# Patient Record
Sex: Female | Born: 1995 | Race: White | Hispanic: No | Marital: Single | Smoking: Never smoker
Health system: Southern US, Community
[De-identification: ages and names within clinical notes are randomized; demographics above are authoritative.]

## PROBLEM LIST (undated history)

## (undated) DIAGNOSIS — F419 Anxiety disorder, unspecified: Secondary | ICD-10-CM

## (undated) DIAGNOSIS — K219 Gastro-esophageal reflux disease without esophagitis: Secondary | ICD-10-CM

## (undated) DIAGNOSIS — J302 Other seasonal allergic rhinitis: Secondary | ICD-10-CM

## (undated) DIAGNOSIS — K589 Irritable bowel syndrome without diarrhea: Secondary | ICD-10-CM

## (undated) HISTORY — PX: ADENOIDECTOMY: SUR15

## (undated) HISTORY — PX: OTHER SURGICAL HISTORY: SHX169

---
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
## 2016-04-17 ENCOUNTER — Encounter (HOSPITAL_BASED_OUTPATIENT_CLINIC_OR_DEPARTMENT_OTHER): Payer: Self-pay

## 2016-04-17 ENCOUNTER — Emergency Department (HOSPITAL_BASED_OUTPATIENT_CLINIC_OR_DEPARTMENT_OTHER): Payer: Commercial Managed Care - HMO

## 2016-04-17 ENCOUNTER — Emergency Department (HOSPITAL_BASED_OUTPATIENT_CLINIC_OR_DEPARTMENT_OTHER)
Admission: EM | Admit: 2016-04-17 | Discharge: 2016-04-17 | Disposition: A | Discharge status: Home / Self Care | Payer: Commercial Managed Care - HMO | Attending: Emergency Medicine | Admitting: Emergency Medicine

## 2016-04-17 DIAGNOSIS — T07XXXA Unspecified multiple injuries, initial encounter: Secondary | ICD-10-CM

## 2016-04-17 DIAGNOSIS — Y939 Activity, unspecified: Secondary | ICD-10-CM | POA: Insufficient documentation

## 2016-04-17 DIAGNOSIS — J45909 Unspecified asthma, uncomplicated: Secondary | ICD-10-CM | POA: Diagnosis not present

## 2016-04-17 DIAGNOSIS — Y999 Unspecified external cause status: Secondary | ICD-10-CM | POA: Insufficient documentation

## 2016-04-17 DIAGNOSIS — S0990XA Unspecified injury of head, initial encounter: Secondary | ICD-10-CM | POA: Diagnosis present

## 2016-04-17 DIAGNOSIS — S0003XA Contusion of scalp, initial encounter: Secondary | ICD-10-CM | POA: Diagnosis not present

## 2016-04-17 DIAGNOSIS — Y929 Unspecified place or not applicable: Secondary | ICD-10-CM | POA: Diagnosis not present

## 2016-04-17 DIAGNOSIS — S40022A Contusion of left upper arm, initial encounter: Secondary | ICD-10-CM | POA: Diagnosis not present

## 2016-04-17 DIAGNOSIS — S40021A Contusion of right upper arm, initial encounter: Secondary | ICD-10-CM | POA: Insufficient documentation

## 2016-04-17 DIAGNOSIS — T71193A Asphyxiation due to mechanical threat to breathing due to other causes, assault, initial encounter: Secondary | ICD-10-CM

## 2016-04-17 DIAGNOSIS — S1093XA Contusion of unspecified part of neck, initial encounter: Secondary | ICD-10-CM | POA: Insufficient documentation

## 2016-04-17 DIAGNOSIS — S0512XA Contusion of eyeball and orbital tissues, left eye, initial encounter: Secondary | ICD-10-CM | POA: Insufficient documentation

## 2016-04-17 LAB — HCG, SERUM, QUALITATIVE: PREG SERUM: NEGATIVE

## 2016-04-17 MED ORDER — HYDROCODONE-ACETAMINOPHEN 5-325 MG PO TABS: 1.0000 | ORAL_TABLET | ORAL | 0 refills | Status: DC | PRN

## 2016-04-17 MED ORDER — ONDANSETRON HCL 4 MG PO TABS: 4.0000 mg | ORAL_TABLET | Freq: Three times a day (TID) | ORAL | 0 refills | Status: DC | PRN

## 2016-04-17 MED ORDER — ONDANSETRON 4 MG PO TBDP
4.0000 mg | ORAL_TABLET | Freq: Once | ORAL | Status: AC
  Administered 2016-04-17: 4 mg via ORAL
  Filled 2016-04-17: qty 1

## 2016-04-17 MED ORDER — IOPAMIDOL (ISOVUE-300) INJECTION 61%
100.0000 mL | Freq: Once | INTRAVENOUS | Status: AC | PRN
  Administered 2016-04-17: 100 mL via INTRAVENOUS

## 2016-04-17 MED ORDER — HYDROCODONE-ACETAMINOPHEN 5-325 MG PO TABS
1.0000 | ORAL_TABLET | Freq: Once | ORAL | Status: AC
  Administered 2016-04-17: 1 via ORAL
  Filled 2016-04-17: qty 1
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
## 2016-08-23 ENCOUNTER — Emergency Department (HOSPITAL_BASED_OUTPATIENT_CLINIC_OR_DEPARTMENT_OTHER)
Admission: EM | Admit: 2016-08-23 | Discharge: 2016-08-23 | Disposition: A | Discharge status: Home / Self Care | Payer: Commercial Managed Care - HMO | Attending: Emergency Medicine | Admitting: Emergency Medicine

## 2016-08-23 ENCOUNTER — Encounter (HOSPITAL_BASED_OUTPATIENT_CLINIC_OR_DEPARTMENT_OTHER): Payer: Self-pay | Admitting: *Deleted

## 2016-08-23 DIAGNOSIS — K219 Gastro-esophageal reflux disease without esophagitis: Secondary | ICD-10-CM

## 2016-08-23 DIAGNOSIS — R112 Nausea with vomiting, unspecified: Secondary | ICD-10-CM | POA: Diagnosis not present

## 2016-08-23 DIAGNOSIS — R197 Diarrhea, unspecified: Secondary | ICD-10-CM | POA: Insufficient documentation

## 2016-08-23 DIAGNOSIS — R1084 Generalized abdominal pain: Secondary | ICD-10-CM | POA: Diagnosis present

## 2016-08-23 DIAGNOSIS — J45909 Unspecified asthma, uncomplicated: Secondary | ICD-10-CM | POA: Diagnosis not present

## 2016-08-23 LAB — CBC
HEMATOCRIT: 42.8 % (ref 36.0–46.0)
HEMOGLOBIN: 14.9 g/dL (ref 12.0–15.0)
MCH: 30.3 pg (ref 26.0–34.0)
MCHC: 34.8 g/dL (ref 30.0–36.0)
MCV: 87.2 fL (ref 78.0–100.0)
Platelets: 271 10*3/uL (ref 150–400)
RBC: 4.91 MIL/uL (ref 3.87–5.11)
RDW: 12 % (ref 11.5–15.5)
WBC: 14 10*3/uL — ABNORMAL HIGH (ref 4.0–10.5)

## 2016-08-23 LAB — COMPREHENSIVE METABOLIC PANEL
ALK PHOS: 61 U/L (ref 38–126)
ALT: 17 U/L (ref 14–54)
ANION GAP: 10 (ref 5–15)
AST: 24 U/L (ref 15–41)
Albumin: 4.6 g/dL (ref 3.5–5.0)
BUN: 12 mg/dL (ref 6–20)
CALCIUM: 9.8 mg/dL (ref 8.9–10.3)
CHLORIDE: 106 mmol/L (ref 101–111)
CO2: 24 mmol/L (ref 22–32)
Creatinine, Ser: 0.88 mg/dL (ref 0.44–1.00)
GFR calc non Af Amer: >60 mL/min (ref >60)
GLUCOSE: 104 mg/dL — AB (ref 65–99)
POTASSIUM: 3.5 mmol/L (ref 3.5–5.1)
Sodium: 140 mmol/L (ref 135–145)
Total Bilirubin: 0.5 mg/dL (ref 0.3–1.2)
Total Protein: 7.3 g/dL (ref 6.5–8.1)

## 2016-08-23 LAB — URINALYSIS, ROUTINE W REFLEX MICROSCOPIC
Bilirubin Urine: NEGATIVE
Glucose, UA: NEGATIVE mg/dL
HGB URINE DIPSTICK: NEGATIVE
Ketones, ur: NEGATIVE mg/dL
Leukocytes, UA: NEGATIVE
Nitrite: NEGATIVE
PH: 8 (ref 5.0–8.0)
Protein, ur: NEGATIVE mg/dL
SPECIFIC GRAVITY, URINE: 1.02 (ref 1.005–1.030)

## 2016-08-23 LAB — LIPASE, BLOOD: Lipase: 25 U/L (ref 11–51)

## 2016-08-23 LAB — PREGNANCY, URINE: PREG TEST UR: NEGATIVE

## 2016-08-23 MED ORDER — SODIUM CHLORIDE 0.9 % IV BOLUS (SEPSIS)
1000.0000 mL | Freq: Once | INTRAVENOUS | Status: AC
Start: 2016-08-23 — End: 2016-08-23
  Administered 2016-08-23: 1000 mL via INTRAVENOUS

## 2016-08-23 MED ORDER — SUCRALFATE 1 G PO TABS: 1.0000 g | ORAL_TABLET | Freq: Three times a day (TID) | ORAL | 0 refills | Status: DC

## 2016-08-23 MED ORDER — PROMETHAZINE HCL 25 MG PO TABS: 25.0000 mg | ORAL_TABLET | Freq: Four times a day (QID) | ORAL | 0 refills | Status: DC | PRN

## 2016-08-23 MED ORDER — SODIUM CHLORIDE 0.9 % IV BOLUS (SEPSIS)
1000.0000 mL | Freq: Once | INTRAVENOUS | Status: AC
  Administered 2016-08-23: 1000 mL via INTRAVENOUS

## 2016-08-23 MED ORDER — LORAZEPAM 2 MG/ML IJ SOLN
0.5000 mg | Freq: Once | INTRAMUSCULAR | Status: AC
  Administered 2016-08-23: 0.5 mg via INTRAVENOUS
  Filled 2016-08-23: qty 1

## 2016-08-23 MED ORDER — GI COCKTAIL ~~LOC~~
30.0000 mL | Freq: Once | ORAL | Status: AC
  Administered 2016-08-23: 30 mL via ORAL
  Filled 2016-08-23: qty 30

## 2016-08-23 MED ORDER — PANTOPRAZOLE SODIUM 40 MG IV SOLR
40.0000 mg | Freq: Once | INTRAVENOUS | Status: AC
  Administered 2016-08-23: 40 mg via INTRAVENOUS
  Filled 2016-08-23: qty 40

## 2016-08-23 MED ORDER — ONDANSETRON HCL 4 MG/2ML IJ SOLN
4.0000 mg | Freq: Once | INTRAMUSCULAR | Status: AC | PRN
  Administered 2016-08-23: 4 mg via INTRAVENOUS

## 2016-08-23 MED ORDER — ONDANSETRON HCL 4 MG/2ML IJ SOLN
INTRAMUSCULAR | Status: AC
  Filled 2016-08-23: qty 2

## 2016-08-23 NOTE — Discharge Instructions (Signed)
Read the information below.  Use the prescribed medication as directed.  Please discuss all new medications with your pharmacist.  You may return to the Emergency Department at any time for worsening condition or any new symptoms that concern you.   If you develop high fevers, worsening abdominal pain, uncontrolled vomiting, or are unable to tolerate fluids by mouth, return to the ER for a recheck.  ° °

## 2016-08-23 NOTE — ED Triage Notes (Signed)
Abdominal pain, N/V/D x 4-5 days.  Pt has had zofran.  Has had chronic 'GI issues' over the last several months-has seen GI.

## 2016-08-23 NOTE — ED Provider Notes (Signed)
[facility]   CSN: [id number] Arrival date & time: 08/23/16  1125     History   Chief Complaint Chief Complaint  Patient presents with  . Abdominal Pain    HPI Kathleen Chang is a 21 y.o. female.  HPI Pt with hx GERD, IBS, currently off her PPI p/w with two weeks of diffuse abdominal pain that is constant, 1 week of constant nausea with vomiting and diarrhea x several days.  States every morning if she does not eat she vomits bile and blood.  She also notes her diarrhea is bloody.  She was seen by her PCP this morning and was going to restart PPI as well as anxiety meds but states she wanted to come to the hospital because she is tired of feeling this way.  Pt has multiple food allergies, unsure of her exposures because she works at [employer] and is unable to control what she eats well.  Has had 5 lb weight loss over 2 weeks.    Past Medical History:  Diagnosis Date  . Asthma   . Seasonal allergies     There are no active problems to display for this patient.   Past Surgical History:  Procedure Laterality Date  . ADENOIDECTOMY    . adnoidectomy      OB History    No data available       Home Medications    Prior to Admission medications   Medication Sig Start Date End Date Taking? Authorizing Provider  ondansetron (ZOFRAN) 4 MG tablet Take 1 tablet (4 mg total) by mouth every 8 (eight) hours as needed for nausea or vomiting. 04/17/16   Loren Racer, MD  promethazine (PHENERGAN) 25 MG tablet Take 1 tablet (25 mg total) by mouth every 6 (six) hours as needed for nausea. 08/23/16   Trixie Dredge, PA-C  sucralfate (CARAFATE) 1 g tablet Take 1 tablet (1 g total) by mouth 4 (four) times daily -  with meals and at bedtime. 08/23/16   Trixie Dredge, PA-C    Family History History reviewed. No pertinent family history.  Social History Social History  Substance Use Topics  . Smoking status: Never Smoker  . Smokeless tobacco: Never Used  .  Alcohol use No     Allergies   Albuterol   Review of Systems Review of Systems  All other systems reviewed and are negative.    Physical Exam Updated Vital Signs BP 99/67 (BP Location: Right Arm)   Pulse 65   Temp 97.8 F (36.6 C) (Oral)   Resp 18   Ht 5\' 1"  (1.549 m)   Wt 47.6 kg (105 lb)   LMP 08/16/2016   SpO2 98%   BMI 19.84 kg/m   Physical Exam  Constitutional: She appears well-developed and well-nourished. No distress.  HENT:  Head: Normocephalic and atraumatic.  Neck: Neck supple.  Cardiovascular: Normal rate and regular rhythm.   Pulmonary/Chest: Effort normal and breath sounds normal. No respiratory distress. She has no wheezes. She has no rales.  Abdominal: Soft. She exhibits no distension. There is tenderness (diffuse). There is no rebound and no guarding.  Neurological: She is alert.  Skin: She is not diaphoretic.  Nursing note and vitals reviewed.    ED Treatments / Results  Labs (all labs ordered are listed, but only abnormal results are displayed) Labs Reviewed  COMPREHENSIVE METABOLIC PANEL - Abnormal; Notable for the following:       Result Value   Glucose, Bld 104 (*)  All other components within normal limits  CBC - Abnormal; Notable for the following:    WBC 14.0 (*)    All other components within normal limits  URINALYSIS, ROUTINE W REFLEX MICROSCOPIC - Abnormal; Notable for the following:    APPearance CLOUDY (*)    All other components within normal limits  LIPASE, BLOOD  PREGNANCY, URINE    EKG  EKG Interpretation None       Radiology No results found.  Procedures Procedures (including critical care time)  Medications Ordered in ED Medications  ondansetron (ZOFRAN) injection 4 mg (4 mg Intravenous Given 08/23/16 1153)  sodium chloride 0.9 % bolus 1,000 mL (0 mLs Intravenous Stopped 08/23/16 1247)  pantoprazole (PROTONIX) injection 40 mg (40 mg Intravenous Given 08/23/16 1206)  LORazepam (ATIVAN) injection 0.5 mg (0.5 mg  Intravenous Given 08/23/16 1206)  gi cocktail (Maalox,Lidocaine,Donnatal) (30 mLs Oral Given 08/23/16 1205)  sodium chloride 0.9 % bolus 1,000 mL (0 mLs Intravenous Stopped 08/23/16 1324)     Initial Impression / Assessment and Plan / ED Course  I have reviewed the triage vital signs and the nursing notes.  Pertinent labs & imaging results that were available during my care of the patient were reviewed by me and considered in my medical decision making (see chart for details).  Clinical Course as of Aug 24 1342  Fri Aug 23, 2016  1309 Tolerating PO.  Feeling much better now.    [EW]  1310 Repeat abdominal exam remains benign.    [EW]    Clinical Course User Index [EW] ChadWest, Hillery Bhalla, [place]PA-C    Afebrile, nontoxic patient with chronic abdominal pain, N/V, constipation/diarrhea c/w previously diagnosed GERD and IBS.  Has been off of her medications.  Had EGD last year and is known to have occasional hematemesis with her symptoms.  Her anxiety has also increased recently.  Was seen by PCP this morning and started back on the majority of her medications but came to ED for symptom relief.  Abdominal exam is benign, nonfocal.  Labs significant only for leukocytosis, which I suspect is related to pain/vomiting, doubt acute infectious process.  Tolerating PO after treatment.   D/C home with addition of carafate and phenergan PRN to omeprazole, zofran, anxiety meds given by PCP.  Discussed result, findings, treatment, and follow up  with patient.  Pt given return precautions.  Pt verbalizes understanding and agrees with plan.       Final Clinical Impressions(s) / ED Diagnoses   Final diagnoses:  Generalized abdominal pain  Nausea vomiting and diarrhea  Gastroesophageal reflux disease, esophagitis presence not specified    New Prescriptions Discharge Medication List as of 08/23/2016  1:12 PM    START taking these medications   Details  promethazine (PHENERGAN) 25 MG tablet Take 1 tablet (25 mg total) by  mouth every 6 (six) hours as needed for nausea., Starting Fri 08/23/2016, Print    sucralfate (CARAFATE) 1 g tablet Take 1 tablet (1 g total) by mouth 4 (four) times daily -  with meals and at bedtime., Starting Fri 08/23/2016, Print         Trixie DredgeWest, Raesean Bartoletti, PA-C 08/23/16 1347    Blane OharaZavitz, Joshua, MD 08/27/16 [phone]

## 2016-08-23 NOTE — ED Notes (Signed)
Patient feeling better, less anxious. Denies any Nausea feeling. The patient playing on phone

## 2016-08-23 NOTE — ED Notes (Signed)
Pt given water 

## 2016-08-23 NOTE — ED Notes (Signed)
Giving medications and patient is shivering with anxiety. Family at the bedside rubbing head  - patient made aware the feelings that she is having related to the medications is typical

## 2017-06-16 ENCOUNTER — Other Ambulatory Visit: Payer: Self-pay

## 2017-06-16 ENCOUNTER — Encounter (HOSPITAL_BASED_OUTPATIENT_CLINIC_OR_DEPARTMENT_OTHER): Payer: Self-pay | Admitting: *Deleted

## 2017-06-16 ENCOUNTER — Emergency Department (HOSPITAL_BASED_OUTPATIENT_CLINIC_OR_DEPARTMENT_OTHER)
Admission: EM | Admit: 2017-06-16 | Discharge: 2017-06-16 | Disposition: A | Discharge status: Home / Self Care | Payer: 59 | Attending: Emergency Medicine | Admitting: Emergency Medicine

## 2017-06-16 ENCOUNTER — Ambulatory Visit (HOSPITAL_BASED_OUTPATIENT_CLINIC_OR_DEPARTMENT_OTHER): Payer: 59

## 2017-06-16 DIAGNOSIS — Z3A01 Less than 8 weeks gestation of pregnancy: Secondary | ICD-10-CM | POA: Diagnosis not present

## 2017-06-16 DIAGNOSIS — J45909 Unspecified asthma, uncomplicated: Secondary | ICD-10-CM | POA: Diagnosis not present

## 2017-06-16 DIAGNOSIS — Z79899 Other long term (current) drug therapy: Secondary | ICD-10-CM | POA: Insufficient documentation

## 2017-06-16 DIAGNOSIS — O9989 Other specified diseases and conditions complicating pregnancy, childbirth and the puerperium: Secondary | ICD-10-CM | POA: Insufficient documentation

## 2017-06-16 DIAGNOSIS — K5900 Constipation, unspecified: Secondary | ICD-10-CM | POA: Diagnosis not present

## 2017-06-16 DIAGNOSIS — Z3201 Encounter for pregnancy test, result positive: Secondary | ICD-10-CM | POA: Diagnosis not present

## 2017-06-16 DIAGNOSIS — R1030 Lower abdominal pain, unspecified: Secondary | ICD-10-CM | POA: Insufficient documentation

## 2017-06-16 DIAGNOSIS — O219 Vomiting of pregnancy, unspecified: Secondary | ICD-10-CM

## 2017-06-16 HISTORY — DX: Gastro-esophageal reflux disease without esophagitis: K21.9

## 2017-06-16 HISTORY — DX: Anxiety disorder, unspecified: F41.9

## 2017-06-16 LAB — URINALYSIS, ROUTINE W REFLEX MICROSCOPIC
BILIRUBIN URINE: NEGATIVE
Glucose, UA: NEGATIVE mg/dL
HGB URINE DIPSTICK: NEGATIVE
Ketones, ur: NEGATIVE mg/dL
Leukocytes, UA: NEGATIVE
Nitrite: NEGATIVE
Protein, ur: NEGATIVE mg/dL
SPECIFIC GRAVITY, URINE: 1.02 (ref 1.005–1.030)
pH: 7 (ref 5.0–8.0)

## 2017-06-16 LAB — HCG, QUANTITATIVE, PREGNANCY: hCG, Beta Chain, Quant, S: 36473 m[IU]/mL — ABNORMAL HIGH (ref <5)

## 2017-06-16 MED ORDER — DOXYLAMINE-PYRIDOXINE 10-10 MG PO TBEC: 1.0000 | DELAYED_RELEASE_TABLET | Freq: Every day | ORAL | 0 refills | Status: DC

## 2017-06-16 MED ORDER — ONDANSETRON HCL 4 MG/2ML IJ SOLN
4.0000 mg | Freq: Once | INTRAMUSCULAR | Status: AC
  Administered 2017-06-16: 4 mg via INTRAVENOUS
  Filled 2017-06-16: qty 2

## 2017-06-16 MED ORDER — SODIUM CHLORIDE 0.9 % IV BOLUS (SEPSIS)
1000.0000 mL | Freq: Once | INTRAVENOUS | Status: AC
  Administered 2017-06-16: 1000 mL via INTRAVENOUS

## 2017-06-16 MED ORDER — THIAMINE HCL 100 MG/ML IJ SOLN
100.0000 mg | Freq: Once | INTRAMUSCULAR | Status: AC
  Administered 2017-06-16: 100 mg via INTRAVENOUS
  Filled 2017-06-16: qty 2

## 2017-06-16 NOTE — ED Notes (Addendum)
EDP into room, prior to RN assessment, see MD notes, orders received and initiated.   

## 2017-06-16 NOTE — ED Notes (Signed)
[facility]olerating sips of [facility]ale[facility]

## 2017-06-16 NOTE — ED Triage Notes (Signed)
Pt states that she started vomiting yesterday morning. States she has been taking zofran without relief. Last dose of zofran was 3am. Pt state she is 7 weeks pregant. C/o headache. Denies any fevers.

## 2017-06-16 NOTE — Discharge Instructions (Addendum)
You were seen today with nausea and vomiting in pregnancy.  Follow-up later today for ultrasound.  Take likely just at night.  If you develop vaginal bleeding, worsening pain, lateralizing pain, or inability to tolerate fluids you should be reevaluated immediately.

## 2017-06-16 NOTE — ED Notes (Addendum)
Pregnant female here for NV. G1P0 LMP:  04/20/17 EDD:  01/24/2018 GA:  [id number] [facility], first appt in April  Alert, NAD, calm, interactive, resps e/u, speaking in clear complete sentences, no dyspnea noted, skin W&D, VSS, c/o ongoing persistent nausea, vomiting onset yesterday, also reports abd cramping, HA, always hot, dry mouth,(denies: fever, diarrhea, pain, sob, dizziness, urinary sx, back pain, vaginal d/c, or visual changes). EDP into room. [facility] Mentions h/o anxiety, and one panic attack. No longer taking/needing zoloft.

## 2017-06-16 NOTE — ED Provider Notes (Signed)
[facility] EMERGENCY DEPARTMENT Provider Note   CSN: [id number] Arrival date & time: 06/16/17  0407     History   Chief Complaint Chief Complaint  Patient presents with  . n/v    HPI Kathleen Chang is a 22 y.o. female.  HPI  This is a 22 year old G1 P0 female approximately [redacted] weeks pregnant who presents with nausea and vomiting.  Patient reports her last menstrual period was the last week in January.  She is due to see her OB on April 1.  Patient reports over the last 24-48 hours she has had multiple episodes of non-bilious, nonbloody emesis.  She has been taking Zofran with minimal relief.  She reports crampy lower abdominal pain that does not lateralize.  She denies any diarrhea.  She reports constipation.  Denies any vaginal discharge or vaginal bleeding.  She has not yet had an ultrasound.  Mother reports that she has been unable to keep anything down.  Past Medical History:  Diagnosis Date  . Anxiety   . Asthma   . GERD (gastroesophageal reflux disease)   . Seasonal allergies     There are no active problems to display for this patient.   Past Surgical History:  Procedure Laterality Date  . ADENOIDECTOMY    . adnoidectomy       OB History    Gravida  1   Para      Term      Preterm      AB      Living        SAB      TAB      Ectopic      Multiple      Live Births               Home Medications    Prior to Admission medications   Medication Sig Start Date End Date Taking? Authorizing Provider  Doxylamine-Pyridoxine 10-10 MG TBEC Take 1 tablet by mouth at bedtime. 06/16/17   Olyvia Gopal, Mayer Maskerourtney F, MD  ondansetron (ZOFRAN) 4 MG tablet Take 1 tablet (4 mg total) by mouth every 8 (eight) hours as needed for nausea or vomiting. 04/17/16   Loren RacerYelverton, David, MD  promethazine (PHENERGAN) 25 MG tablet Take 1 tablet (25 mg total) by mouth every 6 (six) hours as needed for nausea. 08/23/16   Trixie DredgeWest, Emily, PA-C  sucralfate (CARAFATE) 1 g tablet  Take 1 tablet (1 g total) by mouth 4 (four) times daily -  with meals and at bedtime. 08/23/16   Trixie DredgeWest, Emily, PA-C    Family History No family history on file.  Social History Social History   Tobacco Use  . Smoking status: Never Smoker  . Smokeless tobacco: Never Used  Substance Use Topics  . Alcohol use: No  . Drug use: No     Allergies   Albuterol   Review of Systems Review of Systems  Constitutional: Negative for fever.  Respiratory: Negative for shortness of breath.   Cardiovascular: Negative for chest pain.  Gastrointestinal: Positive for abdominal pain, constipation, nausea and vomiting. Negative for diarrhea.  Genitourinary: Negative for dysuria, vaginal bleeding and vaginal pain.  All other systems reviewed and are negative.    Physical Exam Updated Vital Signs BP (!) 104/48   Pulse 77   Temp 98.3 F (36.8 C)   Resp 18   Ht 5\' 1"  (1.549 m)   Wt 49.4 kg (109 lb)   LMP 04/20/2017 (Approximate)   SpO2 99%  BMI 20.60 kg/m   Physical Exam  Constitutional: She is oriented to person, place, and time. She appears well-developed and well-nourished. No distress.  HENT:  Head: Normocephalic and atraumatic.  Cardiovascular: Normal rate, regular rhythm and normal heart sounds.  Pulmonary/Chest: Effort normal. No respiratory distress. She has no wheezes.  Abdominal: Soft. Bowel sounds are normal. There is no tenderness. There is no rebound and no guarding.  Neurological: She is alert and oriented to person, place, and time.  Skin: Skin is warm and dry.  Psychiatric: She has a normal mood and affect.  Nursing note and vitals reviewed.    ED Treatments / Results  Labs (all labs ordered are listed, but only abnormal results are displayed) Labs Reviewed  HCG, QUANTITATIVE, PREGNANCY - Abnormal; Notable for the following components:      Result Value   hCG, Beta Chain, Quant, S 36,473 (*)    All other components within normal limits  URINALYSIS, ROUTINE W  REFLEX MICROSCOPIC - Abnormal; Notable for the following components:   APPearance HAZY (*)    All other components within normal limits    EKG None  Radiology No results found.  Procedures Procedures (including critical care time)  EMERGENCY DEPARTMENT [place] PREGNANCY "Study: Limited Ultrasound of the Pelvis for Pregnancy"  INDICATIONS:Pregnancy(required) Multiple views of the uterus and pelvic cavity were obtained in real-time with a multi-frequency probe.  APPROACH:Transabdominal  PERFORMED BY: Myself IMAGES ARCHIVED?: No LIMITATIONS: Emergent procedure PREGNANCY FREE FLUID: None GESTATIONAL AGE, ESTIMATE: < 8 weeks  FETAL HEART RATE: unable to measure INTERPRETATION: Intrauterine gestational sac noted, Yolk sac noted and Fetal pole present   Transabdominal ultrasound with intrauterine gestational sac, unable to measure fetal pole but fetal pole and yolk sac present      Medications Ordered in ED Medications  sodium chloride 0.9 % bolus 1,000 mL (0 mLs Intravenous Stopped 06/16/17 0517)  thiamine (B-1) injection 100 mg (100 mg Intravenous Given 06/16/17 0443)  ondansetron (ZOFRAN) injection 4 mg (4 mg Intravenous Given 06/16/17 0443)     Initial Impression / Assessment and Plan / ED Course  I have reviewed the triage vital signs and the nursing notes.  Pertinent labs & imaging results that were available during my care of the patient were reviewed by me and considered in my medical decision making (see chart for details).  Clinical Course as of Jun 16 549  Mon Jun 16, 2017  0548 Patient tolerating sips.   [CH]    Clinical Course User Index [CH] Claudean Leavelle, Mayer Masker, MD   Patient presents with nausea and vomiting in early pregnancy.  She is overall nontoxic appearing vital signs reassuring.  She reports abdominal pain but is nonlateralizing and has no significant tenderness on exam.  She denies any vaginal bleeding.  Patient was given fluids.  Urinalysis shows no  evidence of UTI or significant ketones to suggest dehydration.  Beta hCG is 36,473.  On recheck, patient is tolerating sips of fluid.  Bedside ultrasound shows intrauterine gestational sac and a fetal pole.  Unable to measure given transabdominal approach.  This is reassuring.  Will have patient return for formal ultrasound given abdominal cramping.  Will start Diclegis.  After history, exam, and medical workup I feel the patient has been appropriately medically screened and is safe for discharge home. Pertinent diagnoses were discussed with the patient. Patient was given return precautions.   Final Clinical Impressions(s) / ED Diagnoses   Final diagnoses:  Nausea and vomiting in pregnancy  ED Discharge Orders        Ordered    Doxylamine-Pyridoxine 10-10 MG TBEC  Daily at bedtime     06/16/17 0549    US OB Limited     06/16/17 0550    US OB Transvaginal     06/16/17 0550       Shon Baton, MD 06/16/17 [phone]

## 2017-06-16 NOTE — ED Notes (Signed)
EDP in to see, "feeling better, nausea decreased", given ginger ale, up to b/r for urine sample, steady gait.

## 2018-02-12 ENCOUNTER — Emergency Department (HOSPITAL_BASED_OUTPATIENT_CLINIC_OR_DEPARTMENT_OTHER): Payer: 59

## 2018-02-12 ENCOUNTER — Other Ambulatory Visit: Payer: Self-pay

## 2018-02-12 ENCOUNTER — Emergency Department (HOSPITAL_BASED_OUTPATIENT_CLINIC_OR_DEPARTMENT_OTHER)
Admission: EM | Admit: 2018-02-12 | Discharge: 2018-02-12 | Disposition: A | Discharge status: Home / Self Care | Payer: 59 | Attending: Emergency Medicine | Admitting: Emergency Medicine

## 2018-02-12 ENCOUNTER — Encounter (HOSPITAL_BASED_OUTPATIENT_CLINIC_OR_DEPARTMENT_OTHER): Payer: Self-pay

## 2018-02-12 DIAGNOSIS — R1031 Right lower quadrant pain: Secondary | ICD-10-CM | POA: Insufficient documentation

## 2018-02-12 DIAGNOSIS — J45909 Unspecified asthma, uncomplicated: Secondary | ICD-10-CM | POA: Insufficient documentation

## 2018-02-12 LAB — URINALYSIS, ROUTINE W REFLEX MICROSCOPIC
Bilirubin Urine: NEGATIVE
GLUCOSE, UA: NEGATIVE mg/dL
Ketones, ur: NEGATIVE mg/dL
Nitrite: NEGATIVE
PROTEIN: NEGATIVE mg/dL
SPECIFIC GRAVITY, URINE: 1.02 (ref 1.005–1.030)
pH: 6 (ref 5.0–8.0)

## 2018-02-12 LAB — CBC
HCT: 44.7 % (ref 36.0–46.0)
HEMOGLOBIN: 14.2 g/dL (ref 12.0–15.0)
MCH: 28.6 pg (ref 26.0–34.0)
MCHC: 31.8 g/dL (ref 30.0–36.0)
MCV: 89.9 fL (ref 80.0–100.0)
NRBC: 0 % (ref 0.0–0.2)
PLATELETS: 444 10*3/uL — AB (ref 150–400)
RBC: 4.97 MIL/uL (ref 3.87–5.11)
RDW: 12.1 % (ref 11.5–15.5)
WBC: 8.7 10*3/uL (ref 4.0–10.5)

## 2018-02-12 LAB — COMPREHENSIVE METABOLIC PANEL
ALBUMIN: 3.9 g/dL (ref 3.5–5.0)
ALK PHOS: 132 U/L — AB (ref 38–126)
ALT: 16 U/L (ref 0–44)
ANION GAP: 11 (ref 5–15)
AST: 21 U/L (ref 15–41)
BUN: 15 mg/dL (ref 6–20)
CO2: 25 mmol/L (ref 22–32)
Calcium: 9.4 mg/dL (ref 8.9–10.3)
Chloride: 104 mmol/L (ref 98–111)
Creatinine, Ser: 0.87 mg/dL (ref 0.44–1.00)
GFR calc Af Amer: >60 mL/min (ref >60)
GFR calc non Af Amer: >60 mL/min (ref >60)
GLUCOSE: 88 mg/dL (ref 70–99)
Potassium: 3.9 mmol/L (ref 3.5–5.1)
SODIUM: 140 mmol/L (ref 135–145)
Total Bilirubin: 0.5 mg/dL (ref 0.3–1.2)
Total Protein: 7.3 g/dL (ref 6.5–8.1)

## 2018-02-12 LAB — PREGNANCY, URINE: Preg Test, Ur: NEGATIVE

## 2018-02-12 LAB — URINALYSIS, MICROSCOPIC (REFLEX)

## 2018-02-12 LAB — LIPASE, BLOOD: Lipase: 66 U/L — ABNORMAL HIGH (ref 11–51)

## 2018-02-12 MED ORDER — IOPAMIDOL (ISOVUE-300) INJECTION 61%
100.0000 mL | Freq: Once | INTRAVENOUS | Status: AC | PRN
  Administered 2018-02-12: 100 mL via INTRAVENOUS

## 2018-02-12 MED ORDER — ONDANSETRON HCL 4 MG/2ML IJ SOLN
4.0000 mg | Freq: Once | INTRAMUSCULAR | Status: AC
  Administered 2018-02-12: 4 mg via INTRAVENOUS
  Filled 2018-02-12: qty 2

## 2018-02-12 MED ORDER — ONDANSETRON 4 MG PO TBDP: 4.0000 mg | ORAL_TABLET | Freq: Three times a day (TID) | ORAL | 0 refills | Status: DC | PRN

## 2018-02-12 MED ORDER — IOPAMIDOL (ISOVUE-300) INJECTION 61%: 30.0000 mL | Freq: Once | INTRAVENOUS | Status: DC | PRN

## 2018-02-12 NOTE — ED Notes (Signed)
Pt transported to CT-Delay in vitals.

## 2018-02-12 NOTE — ED Triage Notes (Signed)
C/o RLQ pain day 2-csection 2 weeks ago-NAD-steady gait

## 2018-02-12 NOTE — ED Notes (Signed)
Patient is unable to give a urine sample at this time but is aware we need one.

## 2018-02-12 NOTE — ED Provider Notes (Signed)
[facility] EMERGENCY DEPARTMENT Provider Note   CSN: [id number] Arrival date & time: 02/12/18  1129     History   Chief Complaint Chief Complaint  Patient presents with  . Abdominal Pain    HPI Kathleen Chang is a 22 y.o. female w PMHx asthma, GERD, G1P1 status post C-section 2 weeks ago, presenting to the emergency department with acute onset of right lower quadrant abdominal pain that began yesterday.  She states 2 days ago she began feeling nauseous with vomiting.  Yesterday she began having the pain in her lower abdomen.  She states her C-section has been healing well, with normal postpartum visit on Friday.  Denies redness or purulent drainage from the surgical wound.  Denies diarrhea, constipation, urinary symptoms, fevers.  States her vaginal bleeding is gradually improving since delivery. Has intermittently taken her prescribed oxycodone for pain, however did not take it much.  Reports remote history of ovarian cyst in the past, however is not sure if this feels similar.  The history is provided by the patient.    Past Medical History:  Diagnosis Date  . Anxiety   . Asthma   . GERD (gastroesophageal reflux disease)   . Seasonal allergies     There are no active problems to display for this patient.   Past Surgical History:  Procedure Laterality Date  . ADENOIDECTOMY    . adnoidectomy    . CESAREAN SECTION       OB History    Gravida  1   Para      Term      Preterm      AB      Living        SAB      TAB      Ectopic      Multiple      Live Births               Home Medications    Prior to Admission medications   Medication Sig Start Date End Date Taking? Authorizing Provider  Doxylamine-Pyridoxine 10-10 MG TBEC Take 1 tablet by mouth at bedtime. 06/16/17   Horton, Mayer Maskerourtney F, MD  ondansetron (ZOFRAN) 4 MG tablet Take 1 tablet (4 mg total) by mouth every 8 (eight) hours as needed for nausea or vomiting. 04/17/16   Loren RacerYelverton,  David, MD  promethazine (PHENERGAN) 25 MG tablet Take 1 tablet (25 mg total) by mouth every 6 (six) hours as needed for nausea. 08/23/16   Trixie DredgeWest, Emily, PA-C  sucralfate (CARAFATE) 1 g tablet Take 1 tablet (1 g total) by mouth 4 (four) times daily -  with meals and at bedtime. 08/23/16   Trixie DredgeWest, Emily, PA-C    Family History No family history on file.  Social History Social History   Tobacco Use  . Smoking status: Never Smoker  . Smokeless tobacco: Never Used  Substance Use Topics  . Alcohol use: No  . Drug use: No     Allergies   Albuterol   Review of Systems Review of Systems  Constitutional: Negative for fever.  Gastrointestinal: Positive for abdominal pain, nausea and vomiting. Negative for constipation and diarrhea.  Genitourinary: Positive for vaginal bleeding (improving, post-partum).  All other systems reviewed and are negative.    Physical Exam Updated Vital Signs BP (!) 107/59 (BP Location: Right Arm)   Pulse 68   Temp 98.5 F (36.9 C) (Oral)   Resp 18   Ht 5\' 1"  (1.549 m)   Wt  57.2 kg   LMP  (Approximate)   SpO2 100%   Breastfeeding? No   BMI 23.81 kg/m   Physical Exam  Constitutional: She appears well-developed and well-nourished. She does not appear ill. No distress.  HENT:  Head: Normocephalic and atraumatic.  Eyes: Conjunctivae are normal.  Cardiovascular: Normal rate, regular rhythm, normal heart sounds and intact distal pulses.  Pulmonary/Chest: Effort normal and breath sounds normal. No respiratory distress.  Abdominal: Soft. Bowel sounds are normal. She exhibits no distension. There is rebound. There is no guarding.  There is rebound tenderness to the right lower quadrant.  Uterus feels firm, just below the umbilicus and is nontender.  Surgical scar appears to be healing well, is well approximated, without surrounding erythema.  No purulent drainage.  Neurological: She is alert.  Skin: Skin is warm.  Psychiatric: She has a normal mood and affect.  Her behavior is normal.  Nursing note and vitals reviewed.    ED Treatments / Results  Labs (all labs ordered are listed, but only abnormal results are displayed) Labs Reviewed  LIPASE, BLOOD - Abnormal; Notable for the following components:      Result Value   Lipase 66 (*)    All other components within normal limits  COMPREHENSIVE METABOLIC PANEL - Abnormal; Notable for the following components:   Alkaline Phosphatase 132 (*)    All other components within normal limits  CBC - Abnormal; Notable for the following components:   Platelets 444 (*)    All other components within normal limits  URINALYSIS, ROUTINE W REFLEX MICROSCOPIC - Abnormal; Notable for the following components:   Hgb urine dipstick SMALL (*)    Leukocytes, UA TRACE (*)    All other components within normal limits  URINALYSIS, MICROSCOPIC (REFLEX) - Abnormal; Notable for the following components:   Bacteria, UA RARE (*)    All other components within normal limits  PREGNANCY, URINE    EKG None  Radiology Ct Abdomen Pelvis W Contrast  Result Date: 02/12/2018 CLINICAL DATA:  Abdominal pain, appendicitis suspected. RIGHT lower quadrant pain and nausea EXAM: CT ABDOMEN AND PELVIS WITH CONTRAST TECHNIQUE: Multidetector CT imaging of the abdomen and pelvis was performed using the standard protocol following bolus administration of intravenous contrast. CONTRAST:  ISOVUE-300 IOPAMIDOL (ISOVUE-300) INJECTION 61% COMPARISON:  None. FINDINGS: Lower chest: Lung bases are clear. Hepatobiliary: No focal hepatic lesion. No biliary duct dilatation. Gallbladder is normal. Common bile duct is normal. Pancreas: Pancreas is normal. No ductal dilatation. No pancreatic inflammation. Spleen: Normal spleen Adrenals/urinary tract: Adrenal glands and kidneys are normal. The ureters and bladder normal. Stomach/Bowel: Stomach, duodenum and small bowel normal. Tubular structure RIGHT lower quadrant favored the distal ileum leading up  to normal terminal ileum. The appendix is not identified. There is no peri cecal inflammation or fluid. Ascending, transverse and descending colon normal. Rectosigmoid colon normal. Vascular/Lymphatic: Abdominal aorta is normal caliber. There is no retroperitoneal or periportal lymphadenopathy. No pelvic lymphadenopathy. Reproductive: Uterus is large with a anterior wall cervical section defect. The uterus measures 14 cm x 65.6 cm x 7.6 cm. The ovaries are normal. No free-fluid. Other: No free fluid. Musculoskeletal: No aggressive osseous lesion. IMPRESSION: 1. The appendix is not identified however there are no secondary signs of appendicitis. Tubular structure in the RIGHT lower quadrant adjacent to cecum is favored the distal ileum. If patient has continued signs and symptoms appendicitis consider follow-up CT scan. 2. Uterus is large.  Ovaries appear normal. Electronically Signed   By: Roseanne Reno  Amil Amen M.D.   On: 02/12/2018 18:46    Procedures Procedures (including critical care time)  Medications Ordered in ED Medications  iopamidol (ISOVUE-300) 61 % injection 30 mL (has no administration in time range)  iopamidol (ISOVUE-300) 61 % injection 100 mL (has no administration in time range)  ondansetron (ZOFRAN) injection 4 mg (4 mg Intravenous Given 02/12/18 1504)     Initial Impression / Assessment and Plan / ED Course  I have reviewed the triage vital signs and the nursing notes.  Pertinent labs & imaging results that were available during my care of the patient were reviewed by me and considered in my medical decision making (see chart for details).  Clinical Course as of Feb 12 1934  Thu Feb 12, 2018  1916 Results discussed with Dr. Fredderick Phenix. Recommend 24 follow up. Strict return precautions. Pt re-evaluated, reports she feels well and pain is minimal. Discussed CT findings and follow up recommendations. She is agreeable to plan. All questions answered. Safe for discharge.  CT Abdomen Pelvis  W Contrast [JR]    Clinical Course User Index [JR] Jahsir Rama, [place] N, PA-C   Pt is G1P1, 2 weeks postpartum after C-section delivery, presenting to the emergency department with 1 day of right lower quadrant abdominal pain and 2 days of nausea and vomiting.  Had a normal postpartum visit on Friday, surgical wound is healing well and has had no complications.  However, she states 2 days ago she developed intermittent nausea and vomiting and yesterday she began having sharp right lower quadrant abdominal pain.  She attempted to see her OB/GYN who recommended she report to the emergency department given her normal postpartum visit, as this is likely not related.  She has had improving vaginal bleeding since delivery and denies signs of infection to her surgical wound.  On exam, she is well-appearing and not in distress.  She is afebrile with stable vital signs.  Abdomen is soft with mild rebound tenderness to the right lower quadrant, however otherwise unremarkable.  Does have an enlarged palpable uterus that is just below the umbilicus, consistent with post-partum status.  Surgical wound appears to be healing well without purulent drainage or erythema.  Labs are reassuring without leukocytosis.  CMP is reassuring.  UA without signs of infection.  Urine pregnant is negative. Ct ordered to evaluate for acute appendicitis.  She is pain is minimal, and she declines pain medication at this time.  Nausea treated with Zofran.  CT scan does not directly visualize the appendix, however there are no secondary signs of appendicitis.  Ovaries appear normal.  Discussed these results with Dr. Fredderick Phenix.  Plan to discharge patient and recommend close follow-up within 24 hours for reevaluation to determine if repeat imaging is indicated.  Discussed this plan with patient, she is agreeable to this plan.  Answered all questions.  Discussed reassuring lab results.  Discussed strict return precautions, including worsening abdominal  pain, fever, uncontrollable vomiting.  She verbalizes understanding.  Patient is safe for discharge.  Discussed results, findings, treatment and follow up. Patient advised of return precautions. Patient verbalized understanding and agreed with plan.  Final Clinical Impressions(s) / ED Diagnoses   Final diagnoses:  Abdominal pain, RLQ (right lower quadrant)    ED Discharge Orders    None       Rigoberto Repass, [place] N, PA-C 02/12/18 1953    Rolan Bucco, MD 02/12/18 [phone]

## 2018-02-12 NOTE — Discharge Instructions (Addendum)
Your CT scan is not showing signs of acute appendicitis today. Your lab work is reassuring. You can take your prescribed pain medication as needed for pain. You can take Zofran every 8 hours as needed for nausea. Stay hydrated. Schedule appointment with your primary care provider, report to urgent care, or return to the emergency department for recheck in 24 hours. Return to the emergency department sooner if you develop fever, uncontrollable vomiting, severely worsening abdominal pain, or new or concerning symptoms.

## 2018-08-14 ENCOUNTER — Other Ambulatory Visit: Payer: Self-pay

## 2018-08-14 ENCOUNTER — Encounter (HOSPITAL_BASED_OUTPATIENT_CLINIC_OR_DEPARTMENT_OTHER): Payer: Self-pay | Admitting: Emergency Medicine

## 2018-08-14 ENCOUNTER — Emergency Department (HOSPITAL_BASED_OUTPATIENT_CLINIC_OR_DEPARTMENT_OTHER)
Admission: EM | Admit: 2018-08-14 | Discharge: 2018-08-14 | Disposition: A | Discharge status: Home / Self Care | Payer: BLUE CROSS/BLUE SHIELD | Attending: Emergency Medicine | Admitting: Emergency Medicine

## 2018-08-14 DIAGNOSIS — R109 Unspecified abdominal pain: Secondary | ICD-10-CM | POA: Diagnosis present

## 2018-08-14 DIAGNOSIS — J45909 Unspecified asthma, uncomplicated: Secondary | ICD-10-CM | POA: Insufficient documentation

## 2018-08-14 DIAGNOSIS — R112 Nausea with vomiting, unspecified: Secondary | ICD-10-CM | POA: Insufficient documentation

## 2018-08-14 DIAGNOSIS — Z79899 Other long term (current) drug therapy: Secondary | ICD-10-CM | POA: Diagnosis not present

## 2018-08-14 DIAGNOSIS — K581 Irritable bowel syndrome with constipation: Secondary | ICD-10-CM | POA: Diagnosis not present

## 2018-08-14 DIAGNOSIS — F411 Generalized anxiety disorder: Secondary | ICD-10-CM

## 2018-08-14 HISTORY — DX: Irritable bowel syndrome without diarrhea: K58.9

## 2018-08-14 HISTORY — DX: Irritable bowel syndrome, unspecified: K58.9

## 2018-08-14 LAB — CBC WITH DIFFERENTIAL/PLATELET
Abs Immature Granulocytes: 0.02 10*3/uL (ref 0.00–0.07)
Basophils Absolute: 0.1 10*3/uL (ref 0.0–0.1)
Basophils Relative: 1 %
Eosinophils Absolute: 0.4 10*3/uL (ref 0.0–0.5)
Eosinophils Relative: 4 %
HCT: 41.1 % (ref 36.0–46.0)
Hemoglobin: 13.4 g/dL (ref 12.0–15.0)
Immature Granulocytes: 0 %
Lymphocytes Relative: 23 %
Lymphs Abs: 2.1 10*3/uL (ref 0.7–4.0)
MCH: 29.2 pg (ref 26.0–34.0)
MCHC: 32.6 g/dL (ref 30.0–36.0)
MCV: 89.5 fL (ref 80.0–100.0)
Monocytes Absolute: 0.7 10*3/uL (ref 0.1–1.0)
Monocytes Relative: 8 %
Neutro Abs: 5.9 10*3/uL (ref 1.7–7.7)
Neutrophils Relative %: 64 %
Platelets: 255 10*3/uL (ref 150–400)
RBC: 4.59 MIL/uL (ref 3.87–5.11)
RDW: 12.3 % (ref 11.5–15.5)
WBC: 9 10*3/uL (ref 4.0–10.5)
nRBC: 0 % (ref 0.0–0.2)

## 2018-08-14 LAB — COMPREHENSIVE METABOLIC PANEL
ALT: 14 U/L (ref 0–44)
AST: 16 U/L (ref 15–41)
Albumin: 4.3 g/dL (ref 3.5–5.0)
Alkaline Phosphatase: 66 U/L (ref 38–126)
Anion gap: 8 (ref 5–15)
BUN: 10 mg/dL (ref 6–20)
CO2: 22 mmol/L (ref 22–32)
Calcium: 9.4 mg/dL (ref 8.9–10.3)
Chloride: 107 mmol/L (ref 98–111)
Creatinine, Ser: 0.81 mg/dL (ref 0.44–1.00)
GFR calc Af Amer: >60 mL/min (ref >60)
GFR calc non Af Amer: >60 mL/min (ref >60)
Glucose, Bld: 102 mg/dL — ABNORMAL HIGH (ref 70–99)
Potassium: 3.4 mmol/L — ABNORMAL LOW (ref 3.5–5.1)
Sodium: 137 mmol/L (ref 135–145)
Total Bilirubin: 0.5 mg/dL (ref 0.3–1.2)
Total Protein: 7 g/dL (ref 6.5–8.1)

## 2018-08-14 LAB — URINALYSIS, ROUTINE W REFLEX MICROSCOPIC
Glucose, UA: NEGATIVE mg/dL
Hgb urine dipstick: NEGATIVE
Ketones, ur: 15 mg/dL — AB
Leukocytes,Ua: NEGATIVE
Nitrite: NEGATIVE
Protein, ur: NEGATIVE mg/dL
Specific Gravity, Urine: 1.02 (ref 1.005–1.030)
pH: 7 (ref 5.0–8.0)

## 2018-08-14 LAB — PREGNANCY, URINE: Preg Test, Ur: NEGATIVE

## 2018-08-14 LAB — LIPASE, BLOOD: Lipase: 32 U/L (ref 11–51)

## 2018-08-14 MED ORDER — DICYCLOMINE HCL 10 MG PO CAPS
10.0000 mg | ORAL_CAPSULE | Freq: Once | ORAL | Status: AC
  Administered 2018-08-14: 10 mg via ORAL
  Filled 2018-08-14: qty 1

## 2018-08-14 MED ORDER — PROMETHAZINE HCL 25 MG PO TABS: 25.0000 mg | ORAL_TABLET | Freq: Three times a day (TID) | ORAL | 0 refills | Status: AC | PRN

## 2018-08-14 MED ORDER — ALUM & MAG HYDROXIDE-SIMETH 200-200-20 MG/5ML PO SUSP
30.0000 mL | Freq: Once | ORAL | Status: AC
  Administered 2018-08-14: 30 mL via ORAL
  Filled 2018-08-14: qty 30

## 2018-08-14 MED ORDER — HYDROXYZINE HCL 25 MG PO TABS: 25.0000 mg | ORAL_TABLET | Freq: Three times a day (TID) | ORAL | 0 refills | Status: AC | PRN

## 2018-08-14 MED ORDER — LORAZEPAM 2 MG/ML IJ SOLN
0.5000 mg | Freq: Once | INTRAMUSCULAR | Status: AC
  Administered 2018-08-14: 0.5 mg via INTRAVENOUS
  Filled 2018-08-14: qty 1

## 2018-08-14 MED ORDER — METOCLOPRAMIDE HCL 5 MG/ML IJ SOLN
10.0000 mg | INTRAMUSCULAR | Status: AC
  Administered 2018-08-14: 10 mg via INTRAVENOUS
  Filled 2018-08-14: qty 2

## 2018-08-14 MED FILL — PROMETHAZINE 25 MG TABLET: 25 | 3 days supply | Qty: 8 | Fill #0

## 2018-08-14 MED FILL — hydrOXYzine HCL 25 MG TABS: 25 | 3 days supply | Qty: 10 | Fill #0

## 2018-08-14 NOTE — ED Notes (Signed)
Pt has been given ginger ale for her fluid challenge

## 2018-08-14 NOTE — ED Triage Notes (Signed)
Abdominal pain with N/V for 2 weeks.  No fever.  No vaginal discharge.  Pt has been taking miralax, watery diarrhea.

## 2018-08-14 NOTE — ED Notes (Signed)
Pt still anxious, restless; sts she tried to lie down and relax, but she can't

## 2018-08-14 NOTE — ED Provider Notes (Signed)
[facility] EMERGENCY DEPARTMENT Provider Note   CSN: [id number] Arrival date & time: 08/14/18  0720    History   Chief Complaint Chief Complaint  Patient presents with   Abdominal Pain    HPI Kathleen Chang is a 23 y.o. female.     23yo F w/ PMH including anxiety, IBS, GERD, asthma who p/w vomiting and abdominal pain. Pt reports she has a h/o IBS w/ constipation, has seen GI in the past and had colonoscopy and endoscopy previously ~1.5-2 years ago. She is currently 6 months post-partum. During pregnancy, she had constipation so has been taking senokot daily and has continued this medication post-partum. She reports that for the past 2 weeks, she has had intermittent episodes of generalized abdominal cramping and urge to defecate. She states that she then "freaks out" and if she is not able to defecate, she often vomits. She reports recurrent N/V associated with the cramping episodes. She was taking miralax along w/ senokot and has had some diarrhea because of it but it has not improved her other symptoms. She reports vomiting stomach acid and occasionally blood-tinge. She has been having problems with anxiety and is aware that this may be playing a role in her symptoms. She denies feeling depressed. She has not yet found PCP since aging out of pediatrician's office. She has not seen her gastroenterologist recently. She denies NSAID or alcohol use. No vaginal bleeding/discharge, urinary symptoms, fevers, URI sx, sick contacts, or recent travel.   The history is provided by the patient.  Abdominal Pain    Past Medical History:  Diagnosis Date   Anxiety    Asthma    GERD (gastroesophageal reflux disease)    IBS (irritable bowel syndrome)    Seasonal allergies     There are no active problems to display for this patient.   Past Surgical History:  Procedure Laterality Date   ADENOIDECTOMY     adnoidectomy     CESAREAN SECTION       OB History    Gravida  1     Para      Term      Preterm      AB      Living        SAB      TAB      Ectopic      Multiple      Live Births               Home Medications    Prior to Admission medications   Medication Sig Start Date End Date Taking? Authorizing Provider  hydrOXYzine (ATARAX/VISTARIL) 25 MG tablet Take 1 tablet (25 mg total) by mouth every 8 (eight) hours as needed for anxiety. 08/14/18   Flynn Gwyn, Ambrose Finland, MD  levalbuterol [facility] HFA) 45 MCG/ACT inhaler INHALE 2 PUFFS INTO THE LUNGS EVERY 6 (SIX) HOURS. 06/10/18   [provider]  promethazine (PHENERGAN) 25 MG tablet Take 1 tablet (25 mg total) by mouth every 8 (eight) hours as needed for nausea or vomiting. 08/14/18   Cynthia Stainback, Ambrose Finland, MD  SPRINTEC 28 0.25-35 MG-MCG tablet Take 1 tablet by mouth daily. 06/08/18   [provider]    Family History No family history on file.  Social History Social History   Tobacco Use   Smoking status: Never Smoker   Smokeless tobacco: Never Used  Substance Use Topics   Alcohol use: No   Drug use: No     Allergies  Albuterol and Shellfish allergy   Review of Systems Review of Systems  Gastrointestinal: Positive for abdominal pain.   All other systems reviewed and are negative except that which was mentioned in HPI   Physical Exam Updated Vital Signs BP 100/87 (BP Location: Right Arm)    Pulse 85    Temp 98 F (36.7 C) (Oral)    Resp 14    Ht  (1.549 m)    Wt 60.3 kg    LMP 07/27/2018    SpO2 100%    BMI 25.13 kg/m   Physical Exam Vitals signs and nursing note reviewed.  Constitutional:      Appearance: She is well-developed.  HENT:     Head: Normocephalic and atraumatic.     Mouth/Throat:     Mouth: Mucous membranes are moist.     Pharynx: Oropharynx is clear.  Eyes:     Conjunctiva/sclera: Conjunctivae normal.  Neck:     Musculoskeletal: Neck supple.  Cardiovascular:     Rate and Rhythm: Normal rate and regular rhythm.      Heart sounds: Normal heart sounds. No murmur.  Pulmonary:     Effort: Pulmonary effort is normal.     Breath sounds: Normal breath sounds.  Abdominal:     General: Bowel sounds are normal. There is no distension.     Palpations: Abdomen is soft.     Tenderness: There is no abdominal tenderness.  Skin:    General: Skin is warm and dry.  Neurological:     Mental Status: She is alert and oriented to person, place, and time.     Comments: Fluent speech  Psychiatric:        Mood and Affect: Mood is anxious.        Judgment: Judgment normal.     Comments: Tearful, crying and upset during conversation      ED Treatments / Results  Labs (all labs ordered are listed, but only abnormal results are displayed) Labs Reviewed  COMPREHENSIVE METABOLIC PANEL - Abnormal; Notable for the following components:      Result Value   Potassium 3.4 (*)    Glucose, Bld 102 (*)    All other components within normal limits  URINALYSIS, ROUTINE W REFLEX MICROSCOPIC - Abnormal; Notable for the following components:   APPearance CLOUDY (*)    Bilirubin Urine SMALL (*)    Ketones, ur 15 (*)    All other components within normal limits  CBC WITH DIFFERENTIAL/PLATELET  LIPASE, BLOOD  PREGNANCY, URINE    EKG None  Radiology No results found.  Procedures Procedures (including critical care time)  Medications Ordered in ED Medications  metoCLOPramide (REGLAN) injection 10 mg (10 mg Intravenous Given 08/14/18 0805)  dicyclomine (BENTYL) capsule 10 mg (10 mg Oral Given 08/14/18 0810)  LORazepam (ATIVAN) injection 0.5 mg (0.5 mg Intravenous Given 08/14/18 0927)  alum & mag hydroxide-simeth (MAALOX/MYLANTA) 200-200-20 MG/5ML suspension 30 mL (30 mLs Oral Given 08/14/18 1610)     Initial Impression / Assessment and Plan / ED Course  I have reviewed the triage vital signs and the nursing notes.  Pertinent labs & imaging results that were available during my care of the patient were reviewed by me and  considered in my medical decision making (see chart for details).       PT was anxious and crying on exam but with normal VS, no focal abdominal tenderness. Pattern and chronicity of symptoms is highly suggestive of IBS. Chart review shows GI note  from 09/27/2016 with exact same description of episodes. I doubt obstruction or perforation and given no focal lower abd tenderness, I doubt appendicitis, diverticulitis, PID, or pyelonephritis.  All labs including CMP, CBC, UA, UPT normal. On repeat exams, she has sipped on gingerale with no vomiting in ED but continues to be anxious and tearful, often pacing room. I counseled pt on importance of establishing care w/ PCP ASAP in order to address anxiety problems as well as for general health maintenance. I do not feel that a TTS evaluation here would be of much benefit as she does not appear to require hospitalization and will need long-term, ongoing follow up for medication and therapy. I also encouraged to schedule GI appt to discuss treatment options for IBS. Extensively reviewed return precautions. She voiced understanding.  Provided w/ phenergan to use at home as she has used zofran without improvement. Provided w/ atarax to use as needed for severe anxiety, cautioned on side effects and recommended not using in combination w/ phenergan due to sedation.  Final Clinical Impressions(s) / ED Diagnoses   Final diagnoses:  Generalized anxiety disorder  Abdominal pain, unspecified abdominal location  Non-intractable vomiting with nausea, unspecified vomiting type  Irritable bowel syndrome with constipation    ED Discharge Orders         Ordered    promethazine (PHENERGAN) 25 MG tablet  Every 8 hours PRN     08/14/18 1020    hydrOXYzine (ATARAX/VISTARIL) 25 MG tablet  Every 8 hours PRN     08/14/18 1020           Shley Dolby, Ambrose Finlandachel Morgan, MD 08/14/18 1023

## 2019-06-22 ENCOUNTER — Telehealth: Payer: Self-pay | Admitting: Physician Assistant

## 2019-06-22 NOTE — Telephone Encounter (Signed)
Called to discuss with [facility] about Covid symptoms and the use of bamlanivimab or casirivimab/imdevimab, a monoclonal antibody infusion for those with mild to moderate Covid symptoms and at a high risk of hospitalization.    Pt called the infusion center to see if she qualified given her hx of asthma.   Pt is not qualified for this infusion due to lack of identified risk factors and co-morbid conditions.  Symptoms reviewed as well as criteria for ending isolation.  Symptoms reviewed that would warrant ED/Hospital evaluation as well should her condition worsen. Preventative practices reviewed. Patient verbalized understanding.   Cline Crock PA-C

## 2019-11-19 ENCOUNTER — Emergency Department (HOSPITAL_BASED_OUTPATIENT_CLINIC_OR_DEPARTMENT_OTHER)
Admission: EM | Admit: 2019-11-19 | Discharge: 2019-11-19 | Disposition: A | Discharge status: Home / Self Care | Payer: BLUE CROSS/BLUE SHIELD | Attending: Emergency Medicine | Admitting: Emergency Medicine

## 2019-11-19 ENCOUNTER — Other Ambulatory Visit: Payer: Self-pay

## 2019-11-19 ENCOUNTER — Encounter (HOSPITAL_BASED_OUTPATIENT_CLINIC_OR_DEPARTMENT_OTHER): Payer: Self-pay | Admitting: Emergency Medicine

## 2019-11-19 DIAGNOSIS — Z5321 Procedure and treatment not carried out due to patient leaving prior to being seen by health care provider: Secondary | ICD-10-CM | POA: Insufficient documentation

## 2019-11-19 DIAGNOSIS — Z3A10 10 weeks gestation of pregnancy: Secondary | ICD-10-CM | POA: Insufficient documentation

## 2019-11-19 DIAGNOSIS — O219 Vomiting of pregnancy, unspecified: Secondary | ICD-10-CM | POA: Insufficient documentation

## 2019-11-19 LAB — URINALYSIS, MICROSCOPIC (REFLEX)

## 2019-11-19 LAB — URINALYSIS, ROUTINE W REFLEX MICROSCOPIC
Bilirubin Urine: NEGATIVE
Glucose, UA: NEGATIVE mg/dL
Ketones, ur: >80 mg/dL — AB
Leukocytes,Ua: NEGATIVE
Nitrite: NEGATIVE
Protein, ur: NEGATIVE mg/dL
Specific Gravity, Urine: >1.03 — ABNORMAL HIGH (ref 1.005–1.030)
pH: 6 (ref 5.0–8.0)

## 2019-11-19 LAB — PREGNANCY, URINE: Preg Test, Ur: POSITIVE — AB

## 2019-11-19 NOTE — ED Triage Notes (Signed)
PT states [redacted] weeks pregnant. Taking zofran, but could not keep and water or food down starting today. . Denies Vaginal cramping or bleeding.

## 2020-05-05 IMAGING — CT CT ABD-PELV W/ CM
2 of 5 series · 16 of 46 positions shown, 18 images · IV contrast (iopamidol)
Comparison: None.

CLINICAL DATA: Abdominal pain, appendicitis suspected. RIGHT lower
quadrant pain and nausea

EXAM:
CT ABDOMEN AND PELVIS WITH CONTRAST
TECHNIQUE: Multidetector CT imaging of the abdomen and pelvis was performed
using the standard protocol following bolus administration of
intravenous contrast.
CONTRAST:  100mL [id number] IOPAMIDOL ([id number]) INJECTION 61%

[Series 2: axial st · axial · 0.66mm/px · z∈[+350,+740]mm · 13 of 90 slices shown, 15 images]
[im 6/90  soft-tissue]
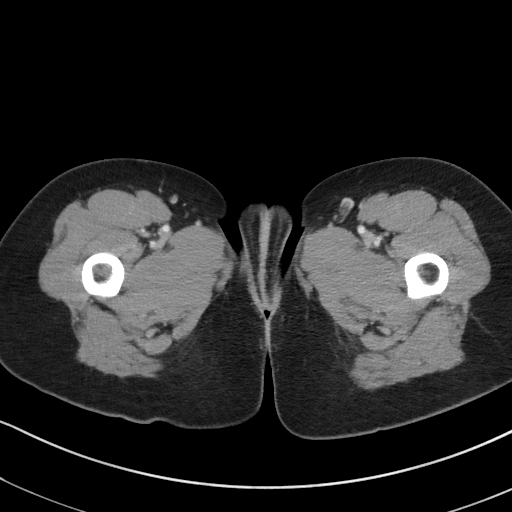
[im 6/90  bone]
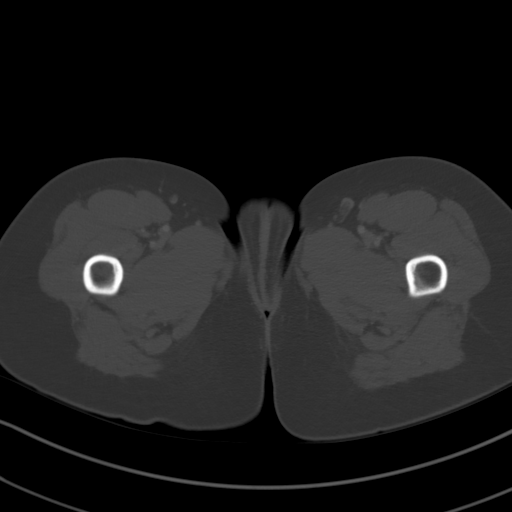
[im 11/90  soft-tissue]
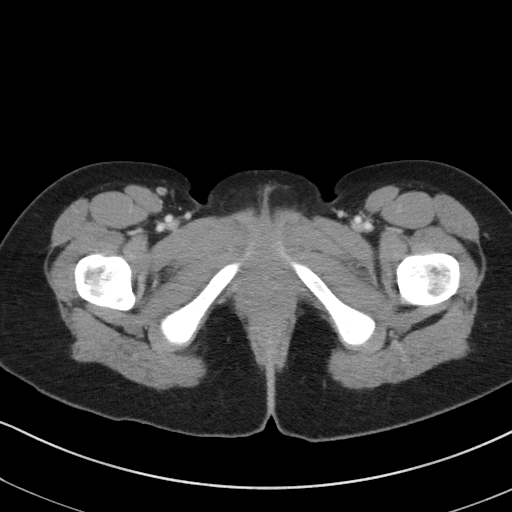
[im 21/90  soft-tissue]
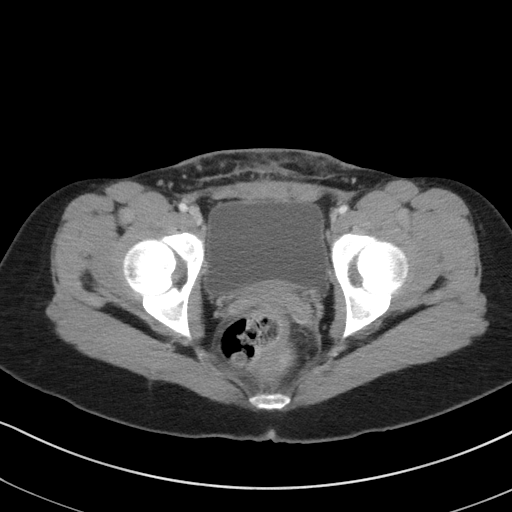
[im 27/90  soft-tissue]
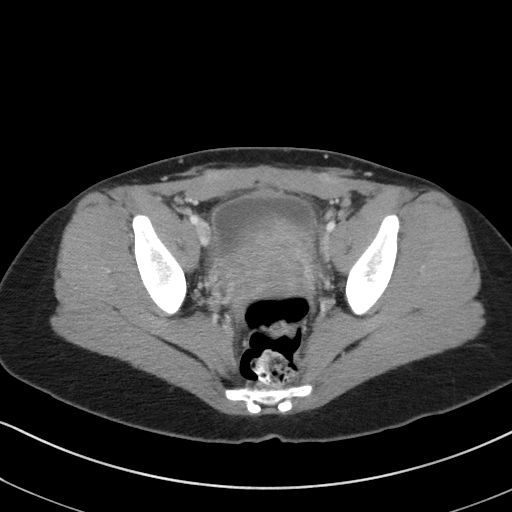
[im 32/90  soft-tissue]
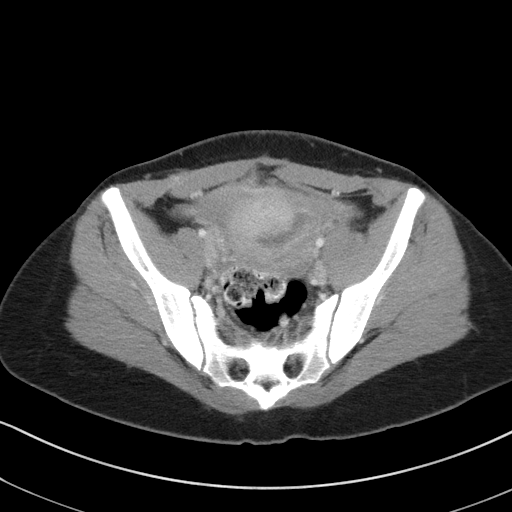
[im 37/90  soft-tissue]
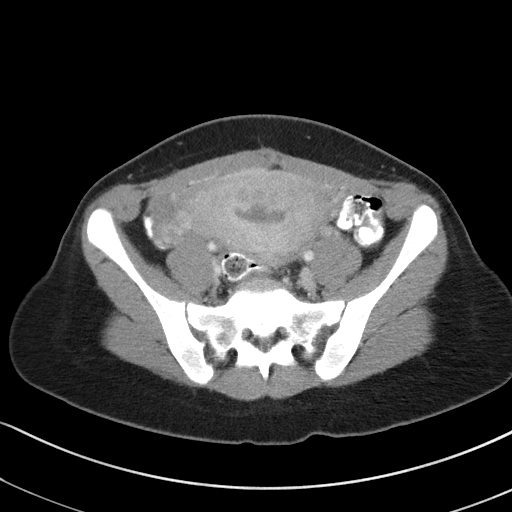
[im 48/90  soft-tissue]
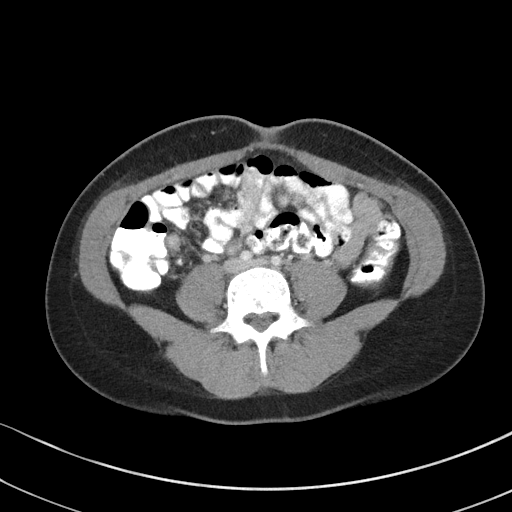
[im 53/90  soft-tissue]
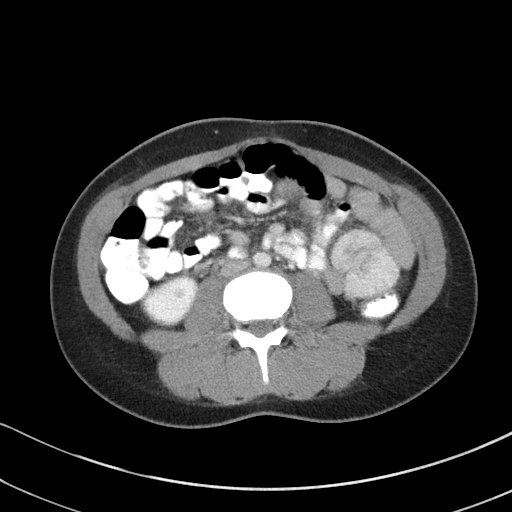
[im 58/90  soft-tissue]
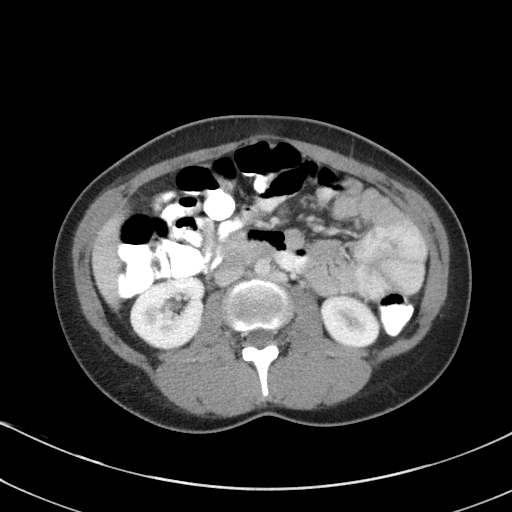
[im 58/90  bone]
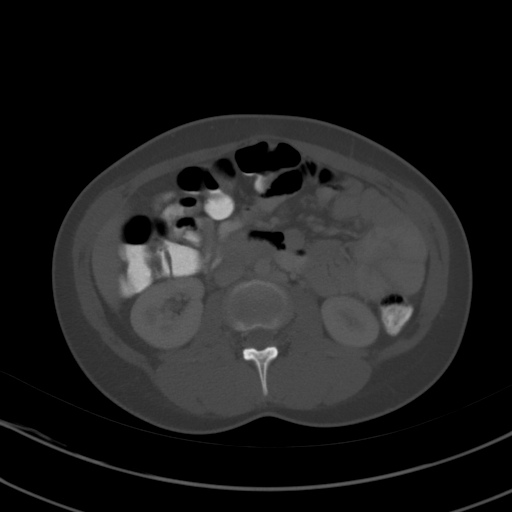
[im 63/90  soft-tissue]
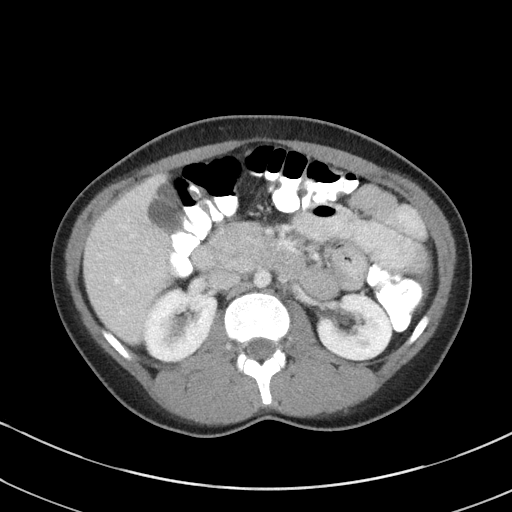
[im 69/90  soft-tissue]
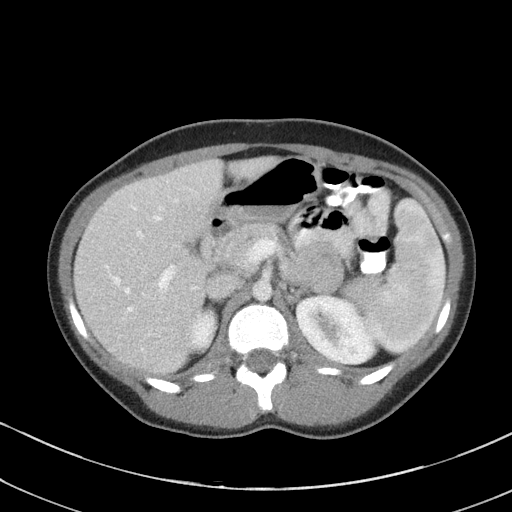
[im 79/90  soft-tissue]
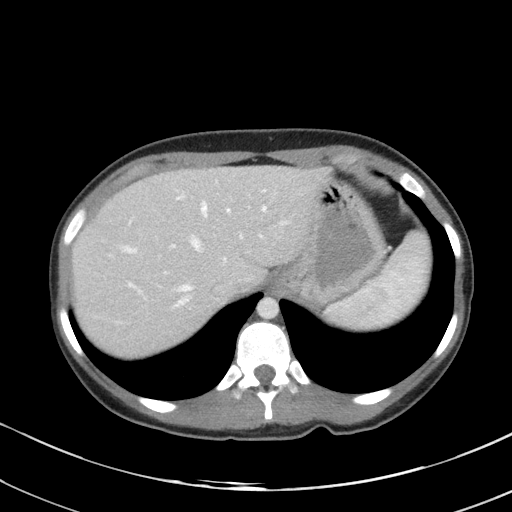
[im 84/90  soft-tissue]
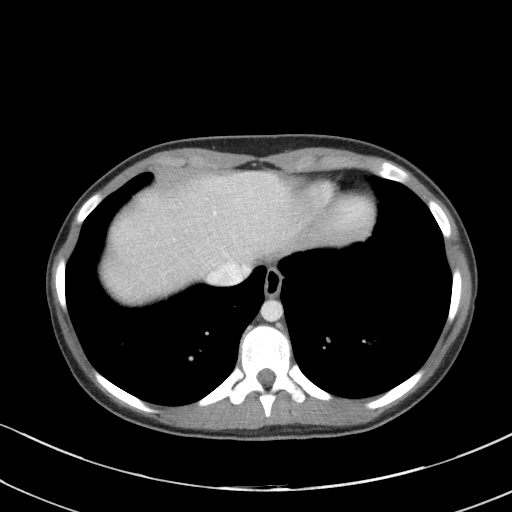

[Series 5: coronal st · coronal · 0.69mm/px · 3 of 70 slices shown]
[im 24/70  soft-tissue]
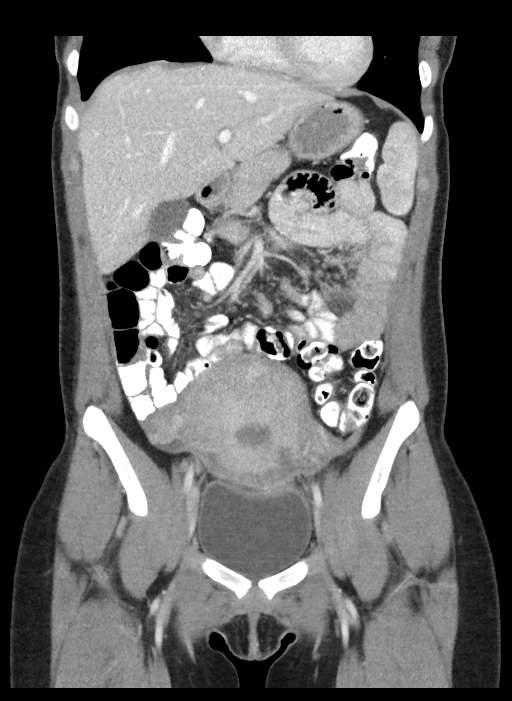
[im 31/70  soft-tissue]
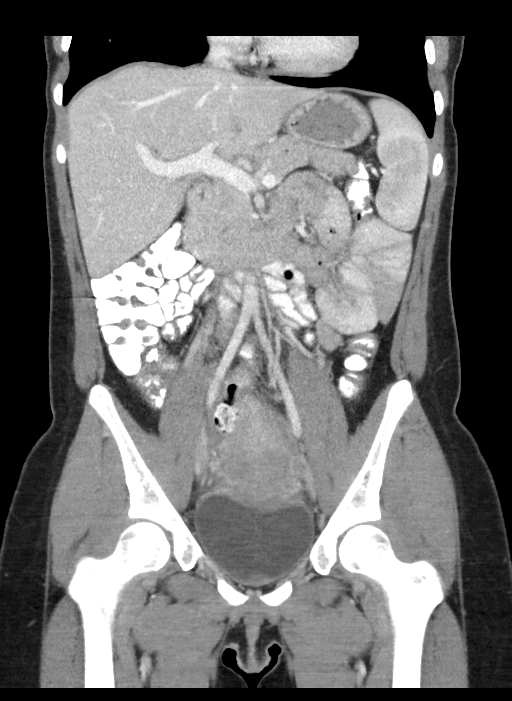
[im 39/70  soft-tissue]
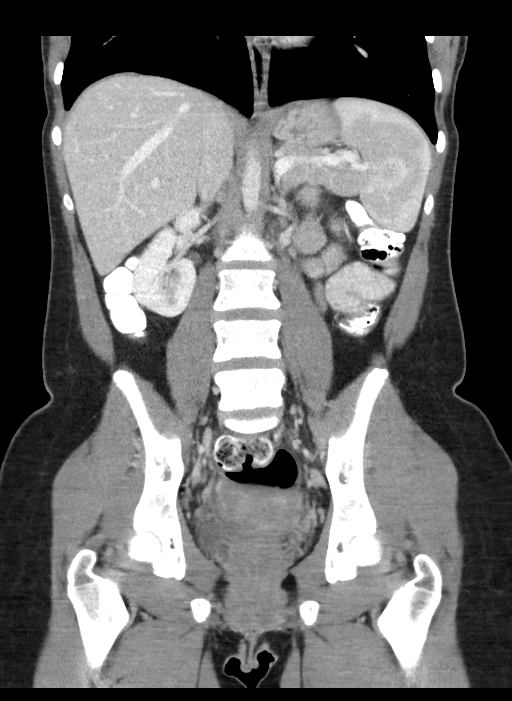

[16 of 46 positions shown; findings below may reference images not displayed]

FINDINGS: Lower chest: Lung bases are clear.

Hepatobiliary: No focal hepatic lesion. No biliary duct dilatation.
Gallbladder is normal. Common bile duct is normal.

Pancreas: Pancreas is normal. No ductal dilatation. No pancreatic
inflammation.

Spleen: Normal spleen

Adrenals/urinary tract: Adrenal glands and kidneys are normal. The
ureters and bladder normal.

Stomach/Bowel: Stomach, duodenum and small bowel normal. Tubular
structure RIGHT lower quadrant favored the distal ileum leading up
to normal terminal ileum. The appendix is not identified. There is
no peri cecal inflammation or fluid. Ascending, transverse and
descending colon normal. Rectosigmoid colon normal.

Vascular/Lymphatic: Abdominal aorta is normal caliber. There is no
retroperitoneal or periportal lymphadenopathy. No pelvic
lymphadenopathy.

Reproductive: Uterus is large with a anterior wall cervical section
defect. The uterus measures 14 cm x 65.6 cm x 7.6 cm. The ovaries
are normal. No free-fluid.

Other: No free fluid.

Musculoskeletal: No aggressive osseous lesion.
IMPRESSION: 1. The appendix is not identified however there are no secondary
signs of appendicitis. Tubular structure in the RIGHT lower quadrant
adjacent to cecum is favored the distal ileum. If patient has
continued signs and symptoms appendicitis consider follow-up CT
scan.
2. Uterus is large.  Ovaries appear normal.
